# Patient Record
Sex: Male | Born: 1965 | Race: White | Hispanic: No | Marital: Married | Smoking: Former smoker
Health system: Southern US, Community
[De-identification: ages and names within clinical notes are randomized; demographics above are authoritative.]

## PROBLEM LIST (undated history)

## (undated) HISTORY — PX: KNEE SURGERY: SHX244

## (undated) HISTORY — PX: HAND SURGERY: SHX662

---

## 2016-07-30 ENCOUNTER — Other Ambulatory Visit: Payer: Self-pay

## 2016-07-30 ENCOUNTER — Emergency Department (HOSPITAL_COMMUNITY): Payer: Self-pay

## 2016-07-30 ENCOUNTER — Encounter (HOSPITAL_COMMUNITY): Payer: Self-pay | Admitting: *Deleted

## 2016-07-30 ENCOUNTER — Emergency Department (HOSPITAL_COMMUNITY)
Admission: EM | Admit: 2016-07-30 | Discharge: 2016-07-30 | Disposition: A | Discharge status: Home / Self Care | Payer: Self-pay | Attending: Emergency Medicine | Admitting: Emergency Medicine

## 2016-07-30 DIAGNOSIS — R072 Precordial pain: Secondary | ICD-10-CM | POA: Insufficient documentation

## 2016-07-30 DIAGNOSIS — R079 Chest pain, unspecified: Secondary | ICD-10-CM

## 2016-07-30 DIAGNOSIS — Z87891 Personal history of nicotine dependence: Secondary | ICD-10-CM | POA: Insufficient documentation

## 2016-07-30 LAB — BASIC METABOLIC PANEL
ANION GAP: 8 (ref 5–15)
BUN: 11 mg/dL (ref 6–20)
CALCIUM: 8.6 mg/dL — AB (ref 8.9–10.3)
CO2: 26 mmol/L (ref 22–32)
CREATININE: 0.89 mg/dL (ref 0.61–1.24)
Chloride: 104 mmol/L (ref 101–111)
GFR calc Af Amer: >60 mL/min (ref >60)
GLUCOSE: 90 mg/dL (ref 65–99)
Potassium: 3.7 mmol/L (ref 3.5–5.1)
Sodium: 138 mmol/L (ref 135–145)

## 2016-07-30 LAB — HEPATIC FUNCTION PANEL
ALT: 94 U/L — ABNORMAL HIGH (ref 17–63)
AST: 112 U/L — ABNORMAL HIGH (ref 15–41)
Albumin: 4.2 g/dL (ref 3.5–5.0)
Alkaline Phosphatase: 63 U/L (ref 38–126)
BILIRUBIN DIRECT: 0.2 mg/dL (ref 0.1–0.5)
BILIRUBIN INDIRECT: 0.3 mg/dL (ref 0.3–0.9)
TOTAL PROTEIN: 7.6 g/dL (ref 6.5–8.1)
Total Bilirubin: 0.5 mg/dL (ref 0.3–1.2)

## 2016-07-30 LAB — CBC
HCT: 41.8 % (ref 39.0–52.0)
Hemoglobin: 14.2 g/dL (ref 13.0–17.0)
MCH: 31.3 pg (ref 26.0–34.0)
MCHC: 34 g/dL (ref 30.0–36.0)
MCV: 92.3 fL (ref 78.0–100.0)
PLATELETS: 102 10*3/uL — AB (ref 150–400)
RBC: 4.53 MIL/uL (ref 4.22–5.81)
RDW: 12.6 % (ref 11.5–15.5)
WBC: 6.8 10*3/uL (ref 4.0–10.5)

## 2016-07-30 LAB — TROPONIN I: Troponin I: <0.03 ng/mL (ref <0.03)

## 2016-07-30 LAB — D-DIMER, QUANTITATIVE (NOT AT ARMC): D DIMER QUANT: 0.69 ug{FEU}/mL — AB (ref 0.00–0.50)

## 2016-07-30 LAB — LIPASE, BLOOD: LIPASE: 39 U/L (ref 11–51)

## 2016-07-30 MED ORDER — FENTANYL CITRATE (PF) 100 MCG/2ML IJ SOLN
50.0000 ug | INTRAMUSCULAR | Status: DC | PRN
  Administered 2016-07-30: 50 ug via INTRAVENOUS
  Filled 2016-07-30: qty 2

## 2016-07-30 MED ORDER — KETOROLAC TROMETHAMINE 30 MG/ML IJ SOLN
30.0000 mg | Freq: Once | INTRAMUSCULAR | Status: AC
  Administered 2016-07-30: 30 mg via INTRAVENOUS
  Filled 2016-07-30: qty 1

## 2016-07-30 MED ORDER — ASPIRIN EC 325 MG PO TBEC: 325.0000 mg | DELAYED_RELEASE_TABLET | Freq: Every day | ORAL | 0 refills | Status: AC

## 2016-07-30 MED ORDER — IOPAMIDOL (ISOVUE-370) INJECTION 76%
100.0000 mL | Freq: Once | INTRAVENOUS | Status: AC | PRN
  Administered 2016-07-30: 100 mL via INTRAVENOUS

## 2016-07-30 NOTE — ED Triage Notes (Signed)
Pt c/o chest pain x 3 days with the pain getting worse tonight while watching tv and lying down; pt states he is unable to take a deep breath; O2 sats maintaining in the upper 90's; pt is coughing up blood tinged clear sputum

## 2016-07-30 NOTE — ED Provider Notes (Signed)
[facility]-EM[facility]Y DEPT Provider Note   CSN: [id number] Arrival date & time: 07/30/16  [id number]  First Provider Contact:  First MD Initiated Contact with Patient 07/30/16 0201        History   Chief Complaint Chief Complaint  Patient presents with  . Chest Pain    HPI Bradly BienenstockRobert Siegenthaler is a 50 y.o. male.  The history is provided by the patient and a significant other.  Chest Pain   This is a new problem. The current episode started more than 2 days ago. The problem occurs daily. The problem has been gradually worsening. The pain is present in the substernal region. The pain is moderate. The pain does not radiate. The symptoms are aggravated by deep breathing. Associated symptoms include nausea and shortness of breath. Pertinent negatives include no cough, no diaphoresis and no fever. He has tried nothing for the symptoms. Risk factors include male gender.  Pertinent negatives for past medical history include no DVT, no MI and no PE.  Patient reports for past 3-4 days he has had left sided CP Seems worse with deep breathing but endorses pressure like sensation No fever He reports nausea and dry heaving He denies cough/hemoptysis (told nurse he had hemoptysis but tells me it is due to "dry heaving") No LE pain/edema No syncope He denies h/o CAD/PE/DVT [place], but reports CP started prior to his trip    PMH - GERD Soc hx -occasional ETOH use, smokes cigarettes Fam hx - negative for premature CAD  Past Surgical History:  Procedure Laterality Date  . HAND SURGERY Right   . KNEE SURGERY Left        Home Medications    Prior to Admission medications   Medication Sig Start Date End Date Taking? Authorizing Provider  esomeprazole (NEXIUM) 20 MG capsule Take 20 mg by mouth daily at 12 noon.   Yes Historical Provider, MD    Family History History reviewed. No pertinent family history.  Social History Social History  Substance Use Topics  .  Smoking status: Former Games developermoker  . Smokeless tobacco: Former NeurosurgeonUser  . Alcohol use Yes     Comment: occasionally     Allergies   Penicillins   Review of Systems Review of Systems  Constitutional: Negative for diaphoresis and fever.  Respiratory: Positive for shortness of breath. Negative for cough.   Cardiovascular: Positive for chest pain.  Gastrointestinal: Positive for nausea.  Neurological: Negative for syncope.  All other systems reviewed and are negative.    Physical Exam Updated Vital Signs BP (!) 147/104   Pulse 90   Temp 98.7 F (37.1 C) (Oral)   SpO2 97%   Physical Exam CONSTITUTIONAL: Well developed/well nourished, anxious HEAD: Normocephalic/atraumatic EYES: EOMI/PERRL ENMT: Mucous membranes moist NECK: supple no meningeal signs SPINE/BACK:entire spine nontender CV: S1/S2 noted, no murmurs/rubs/gallops noted LUNGS: Lungs are clear to auscultation bilaterally, no apparent distress Chest - no tenderness or bruising noted ABDOMEN: soft, nontender, no rebound or guarding, bowel sounds noted throughout abdomen GU:no cva tenderness NEURO: Pt is awake/alert/appropriate, moves all extremitiesx4.  No facial droop.   EXTREMITIES: pulses normal/equal, full ROM, no LE edema/tenderness SKIN: warm, color normal PSYCH: no abnormalities of mood noted, alert and oriented to situation   ED Treatments / Results  Labs (all labs ordered are listed, but only abnormal results are displayed) Labs Reviewed  BASIC METABOLIC PANEL - Abnormal; Notable for the following:       Result Value  Calcium 8.6 (*)    All other components within normal limits  CBC - Abnormal; Notable for the following:    Platelets 102 (*)    All other components within normal limits  HEPATIC FUNCTION PANEL - Abnormal; Notable for the following:    AST 112 (*)    ALT 94 (*)    All other components within normal limits  D-DIMER, QUANTITATIVE (NOT AT [facility]) - Abnormal; Notable for the following:     D-Dimer, Quant 0.69 (*)    All other components within normal limits  TROPONIN I  LIPASE, BLOOD  TROPONIN I    EKG ED ECG REPORT   Date: 07/30/2016 01:08am  Rate: 83  Rhythm: normal sinus rhythm  QRS Axis: normal  Intervals: normal  ST/T Wave abnormalities: nonspecific ST changes  Conduction Disutrbances:none  Narrative Interpretation: PVCs noted  Old EKG Reviewed: none available  I have personally reviewed the EKG tracing and agree with the computerized printout as noted.  Radiology Dg Chest 2 View  Result Date: 07/30/2016 CLINICAL DATA:  Left-sided chest pain for 3 days, worse tonight. EXAM: CHEST  2 VIEW COMPARISON:  None. FINDINGS: The cardiomediastinal contours are normal. The lungs are clear. Pulmonary vasculature is normal. No consolidation, pleural effusion, or pneumothorax. No acute osseous abnormalities are seen. Degenerative change in the lower thoracic spine IMPRESSION: No acute pulmonary process. Electronically Signed   By: Rubye Oaks M.D.   On: 07/30/2016 03:33   Ct Angio Chest Pe W And/or Wo Contrast  Result Date: 07/30/2016 CLINICAL DATA:  Chest pain for 3 days and productive cough. EXAM: CT ANGIOGRAPHY CHEST WITH CONTRAST TECHNIQUE: Multidetector CT imaging of the chest was performed using the standard protocol during bolus administration of intravenous contrast. Multiplanar CT image reconstructions and MIPs were obtained to evaluate the vascular anatomy. CONTRAST:  100 mL Isovue 370 IV COMPARISON:  Chest x-ray earlier today FINDINGS: Cardiovascular: The pulmonary arteries are well opacified. There is no evidence of pulmonary embolism. The thoracic aorta is normal in caliber. Coronary atherosclerosis identified with visible calcified plaque in the distribution of the LAD and left circumflex coronary arteries. The heart size is normal. No pericardial fluid identified. Mediastinum/Nodes: No evidence of mediastinal masses or lymphadenopathy. No hilar lymphadenopathy  identified. Lungs/Pleura: Mild scarring present at the left lung base. There is no evidence of pulmonary edema, consolidation, pneumothorax, nodule or pleural fluid. Upper Abdomen: Visualized upper abdominal structures are unremarkable. Musculoskeletal: Normal visualized bony structures. Review of the MIP images confirms the above findings. IMPRESSION: No evidence of pulmonary embolism or other acute findings. Evidence of coronary atherosclerosis with calcified plaque in the distribution of the LAD and left circumflex coronary arteries. Electronically Signed   By: Irish Lack M.D.   On: 07/30/2016 07:15    Procedures Procedures (including critical care time)  Medications Ordered in ED Medications  fentaNYL (SUBLIMAZE) injection 50 mcg (50 mcg Intravenous Given 07/30/16 0202)  ketorolac (TORADOL) 30 MG/ML injection 30 mg (30 mg Intravenous Given 07/30/16 0459)  iopamidol (ISOVUE-370) 76 % injection 100 mL (100 mLs Intravenous Contrast Given 07/30/16 0702)     Initial Impression / Assessment and Plan / ED Course  I have reviewed the triage vital signs and the nursing notes.  Pertinent labs & imaging results that were available during my care of the patient were reviewed by me and considered in my medical decision making (see chart for details).  Clinical Course    Pt appears anxious Current HEART score = 3 Workup pending  at this time 5:01 AM Pt with continued pain, though improved It remains in one location, worse with inspiration Pulse ox dropped to 94% Will add on d-dimer Repeat ekg performed  EKG Interpretation  Date/Time:  Wednesday July 30 2016 04:29:48 EDT Ventricular Rate:  75 PR Interval:    QRS Duration: 97 QT Interval:  399 QTC Calculation: 446 R Axis:   23 Text Interpretation:  Sinus rhythm RSR' in V1 or V2, probably normal variant no PVC noted on this EKG Confirmed by Bebe Shaggy  MD, Vaibhav Fogleman ([id number]) on 07/30/2016 5:01:34 AM      7:57 AM Pt improved He reports most  of his CP is worse with breathing.  He also reports some of his CP is worse with lying on his left side History very atypical for ACS.  He does report some pain with walking but also positional/pleuritic PE ruled out by CT chest He did have some coronary calcification on CT This was d/w patient He will be started on ASA. He has been referred for close f/u with cardiology as he has no PCP and just moved here Discussed strict ER return precautions, particularly any severe CP/pressure with sob/diaphoresis/vomiting Advised to quit smoking   Final Clinical Impressions(s) / ED Diagnoses   Final diagnoses:  Chest pain, unspecified chest pain type    New Prescriptions New Prescriptions   ASPIRIN EC 325 MG TABLET    Take 1 tablet (325 mg total) by mouth daily.     Zadie Rhine, MD 07/30/16 0800

## 2016-07-30 NOTE — Discharge Instructions (Signed)
YOU NEED TO HAVE CLOSE FOLLOWUP WITH HEART SPECIALIST AS WE DISCUSSED YOU MAY HAVE HEART DISEASE AND YOU NEED CLOSE FOLLOWUP AND TESTING PLEASE CALL THE NUMBER PROVIDED FOR FOLLOWUP SOON  Your caregiver has diagnosed you as having chest pain that is not specific for one problem, but does not require admission.  Chest pain comes from many different causes.  SEEK IMMEDIATE MEDICAL ATTENTION IF: You have severe chest pain, especially if the pain is crushing or pressure-like and spreads to the arms, back, neck, or jaw, or if you have sweating, nausea (feeling sick to your stomach), or shortness of breath. THIS IS AN EMERGENCY. Don't wait to see if the pain will go away. Get medical help at once. Call 911 or [phone] (operator). DO NOT drive yourself to the hospital.  Your chest pain gets worse and does not go away with rest.  You have an attack of chest pain lasting longer than usual, despite rest and treatment with the medications your caregiver has prescribed.  You wake from sleep with chest pain or shortness of breath.  You feel dizzy or faint.  You have chest pain not typical of your usual pain for which you originally saw your caregiver.

## 2016-07-30 NOTE — ED Notes (Signed)
Pt

## 2016-07-30 NOTE — ED Notes (Addendum)
Iv insertion not charted. Iv 20 g to right lateral fa taken out with cath intact. Skin integrity intact. EDP aware of dc bp. Pt educated on importance of fu with cardiologist

## 2016-08-14 ENCOUNTER — Encounter: Payer: Self-pay | Admitting: Cardiology

## 2018-02-07 IMAGING — DX DG CHEST 2V
2 series · 2 of 2 positions shown · non-contrast
Comparison: None.

CLINICAL DATA: Left-sided chest pain for 3 days, worse tonight.

EXAM:
CHEST  2 VIEW

[chest pa]
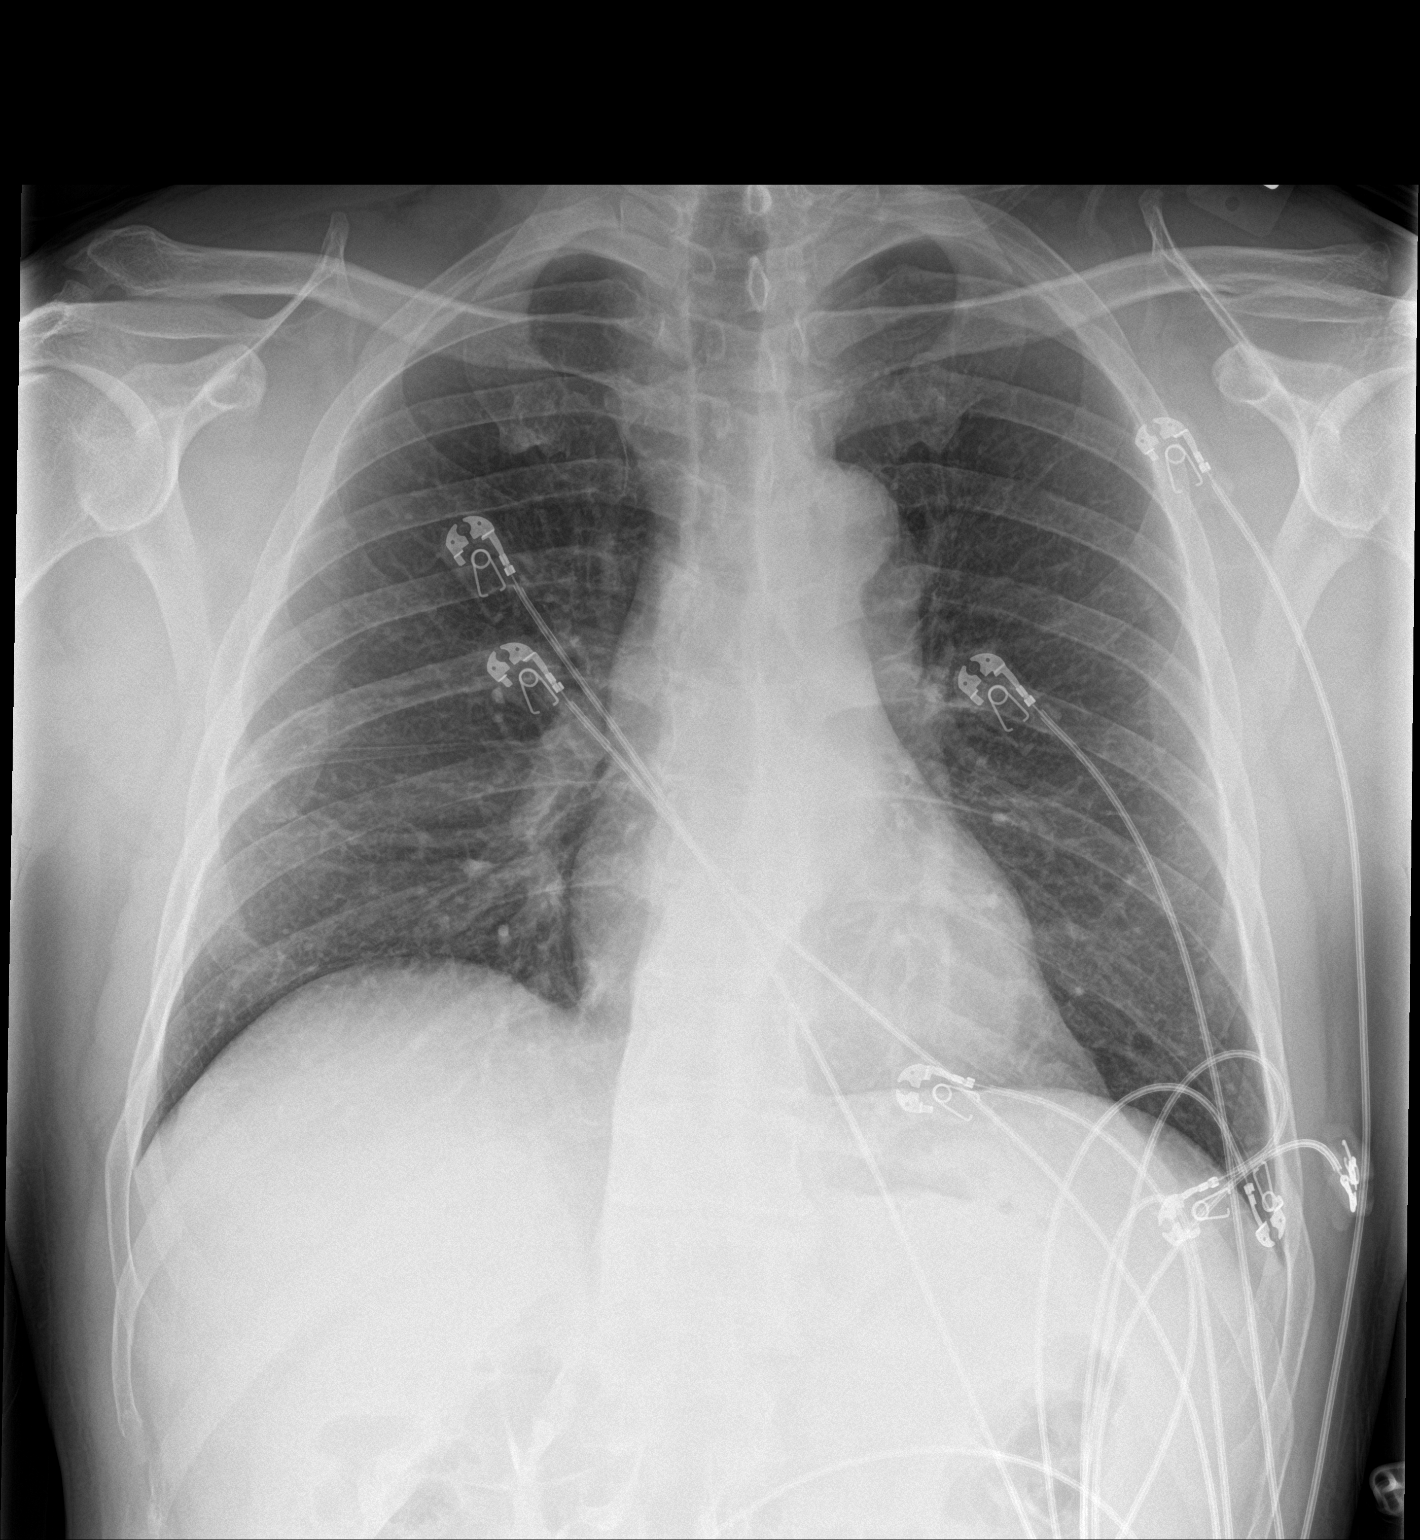

[chest lat]
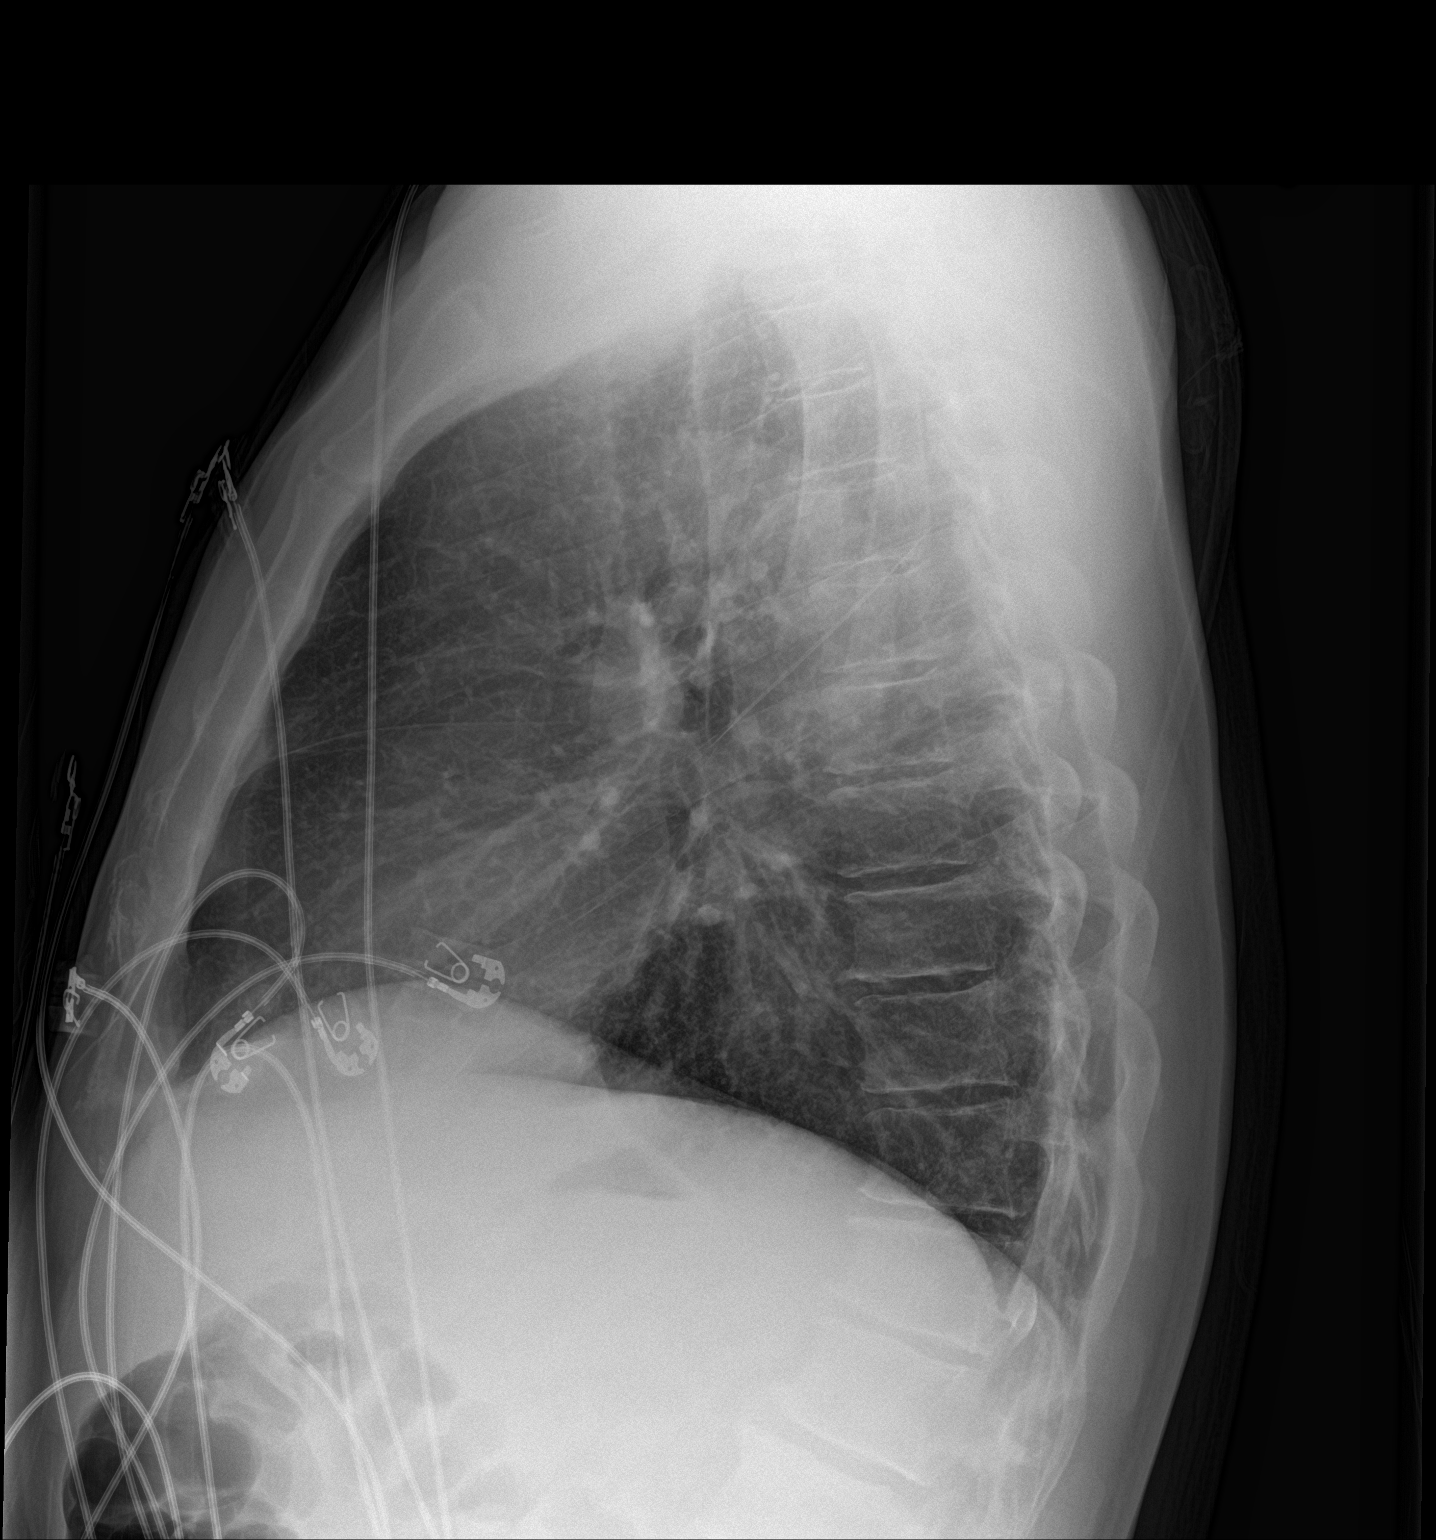

[2 of 2 positions shown; findings below may reference images not displayed]

FINDINGS: The cardiomediastinal contours are normal. The lungs are clear.
Pulmonary vasculature is normal. No consolidation, pleural effusion,
or pneumothorax. No acute osseous abnormalities are seen.
Degenerative change in the lower thoracic spine
IMPRESSION: No acute pulmonary process.

## 2018-02-07 IMAGING — CT CT ANGIO CHEST
2 of 6 series · 18 of 46 positions shown · IV contrast (ISOVUE)
Comparison: Chest x-ray earlier today

CLINICAL DATA: Chest pain for 3 days and productive cough.

EXAM:
CT ANGIOGRAPHY CHEST WITH CONTRAST
TECHNIQUE: Multidetector CT imaging of the chest was performed using the
standard protocol during bolus administration of intravenous
contrast. Multiplanar CT image reconstructions and MIPs were
obtained to evaluate the vascular anatomy.
CONTRAST:  100 mL Isovue 370 IV

[Series 5: pe thins 1.0 · axial · 0.71mm/px · z∈[-224,-6]mm · 15 of 244 slices shown]
[im 13/244  lung]
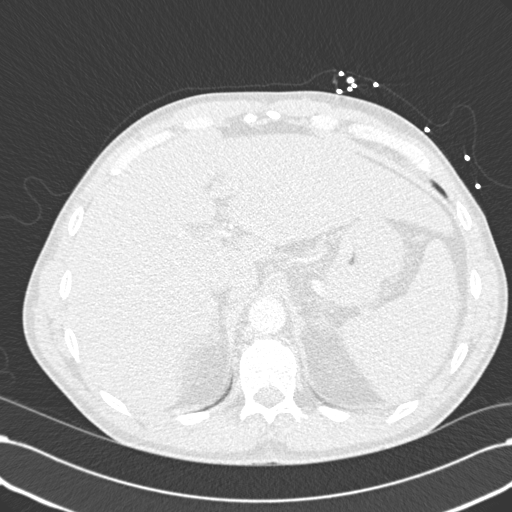
[im 25/244  soft-tissue]
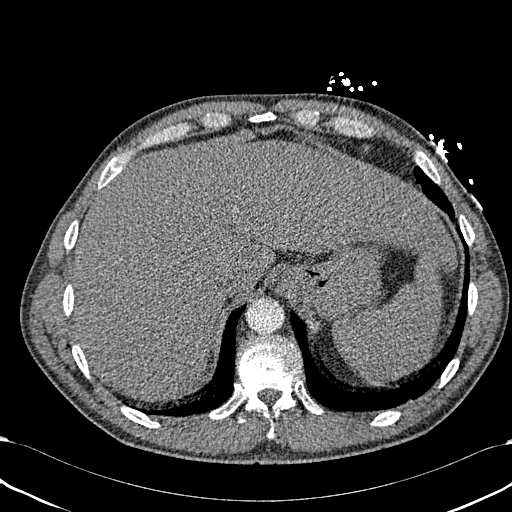
[im 49/244  lung]
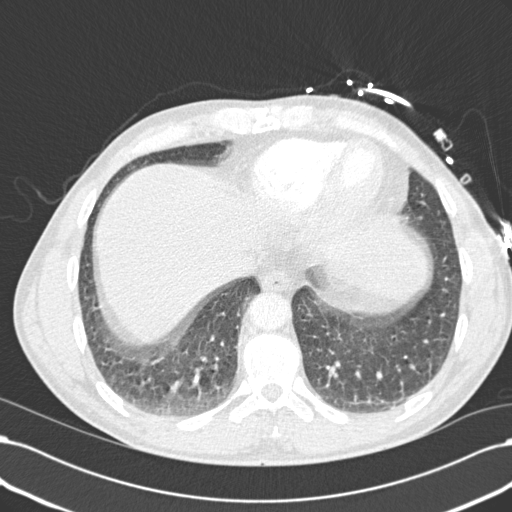
[im 61/244  soft-tissue]
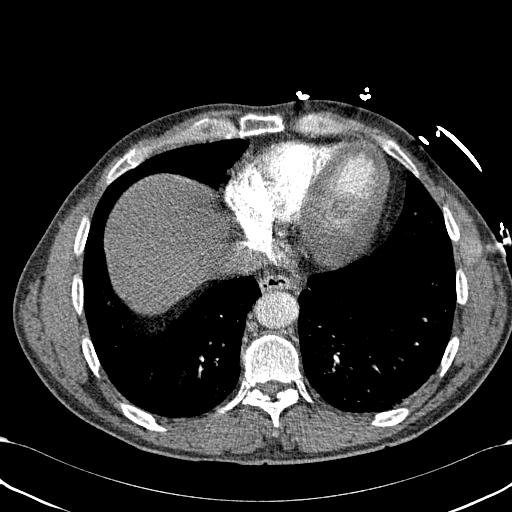
[im 73/244  lung]
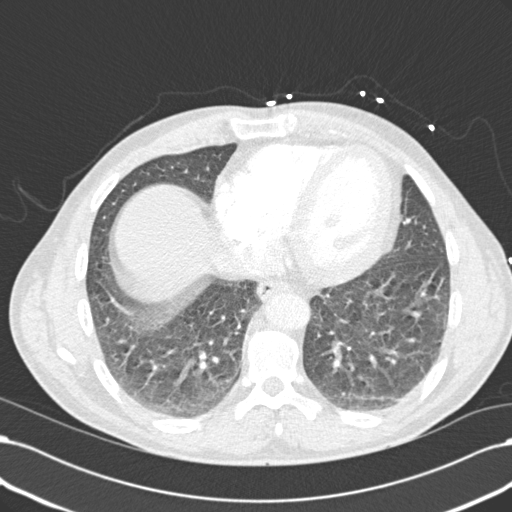
[im 86/244  soft-tissue]
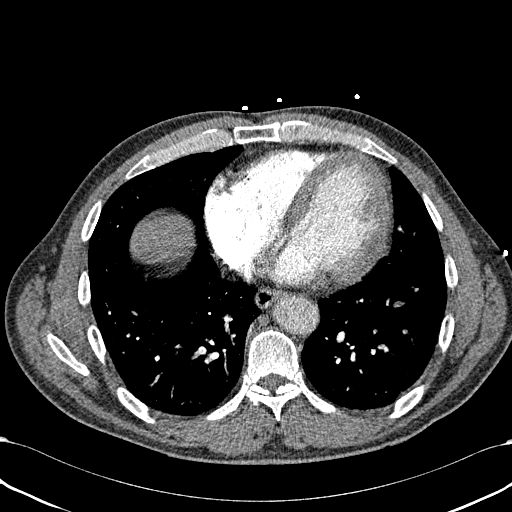
[im 110/244  lung]
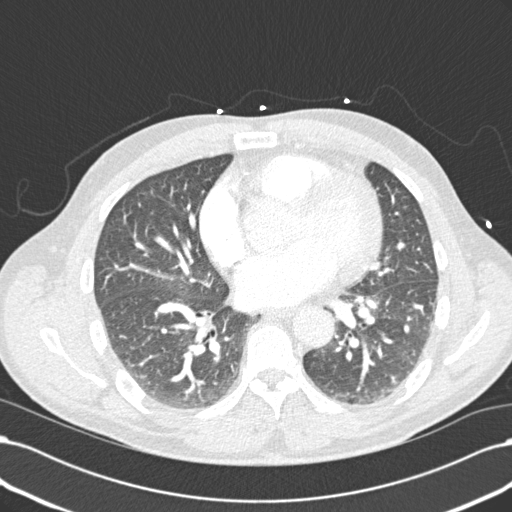
[im 122/244  soft-tissue]
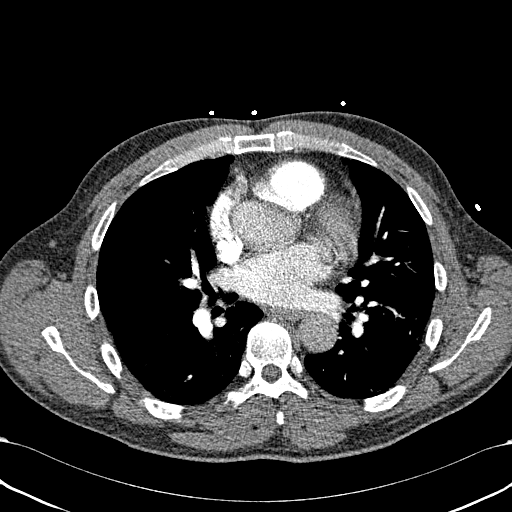
[im 134/244  lung]
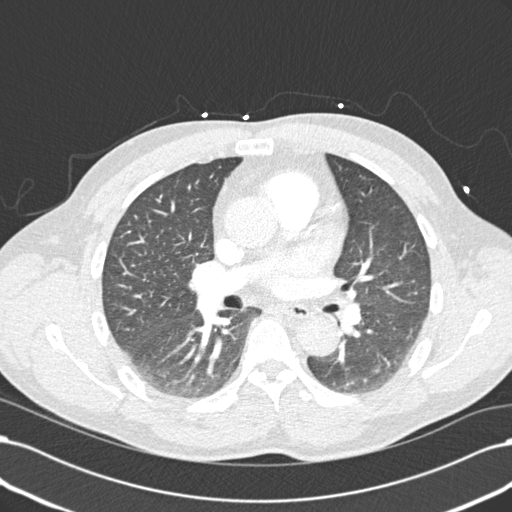
[im 158/244  soft-tissue]
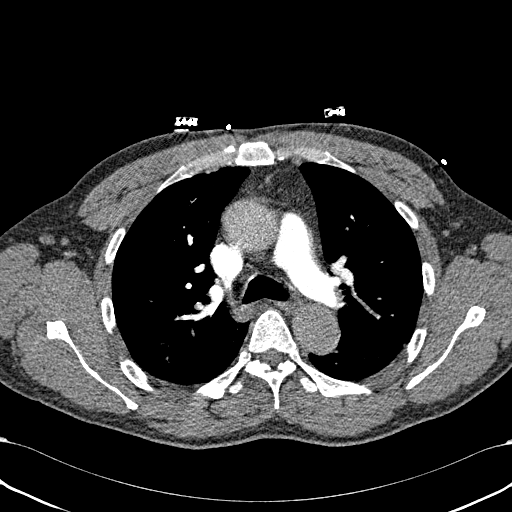
[im 171/244  lung]
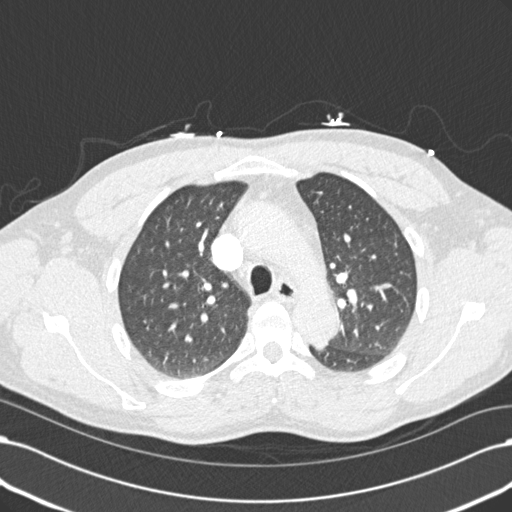
[im 183/244  soft-tissue]
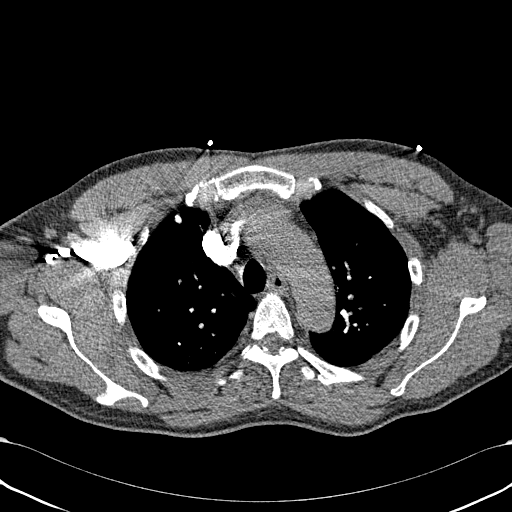
[im 195/244  lung]
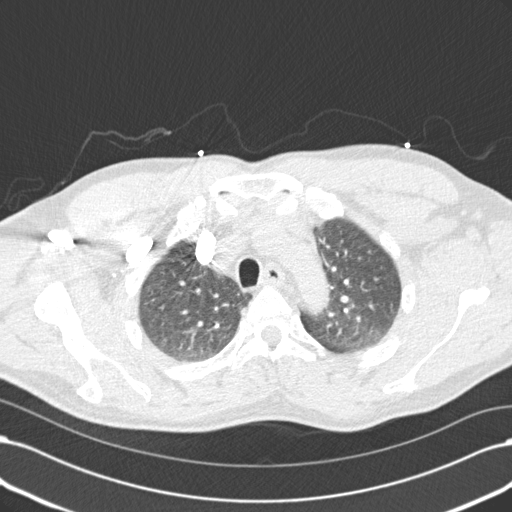
[im 219/244  soft-tissue]
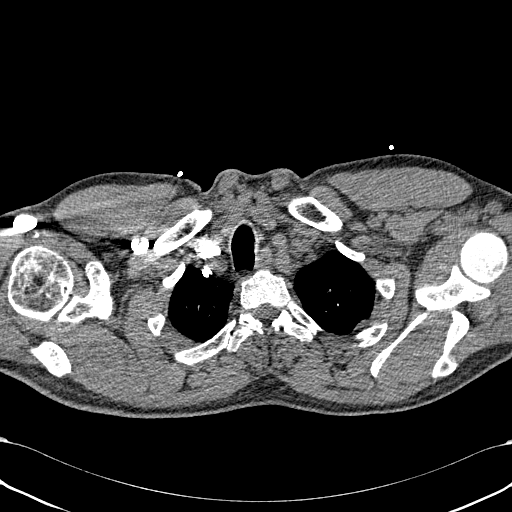
[im 231/244  lung]
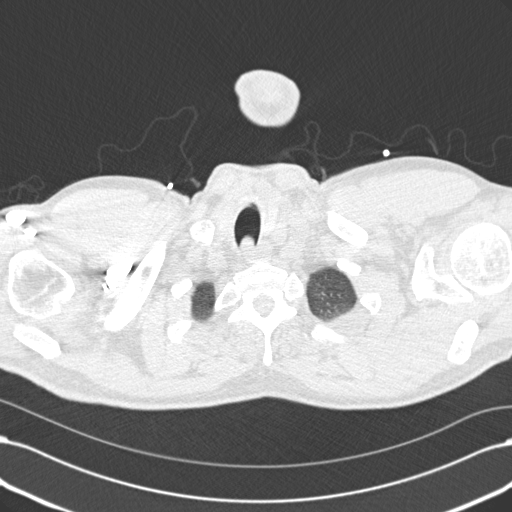

[Series 7: cor mpr 2.0 · coronal · 0.50mm/px · 3 of 118 slices shown]
[im 30/118  soft-tissue]
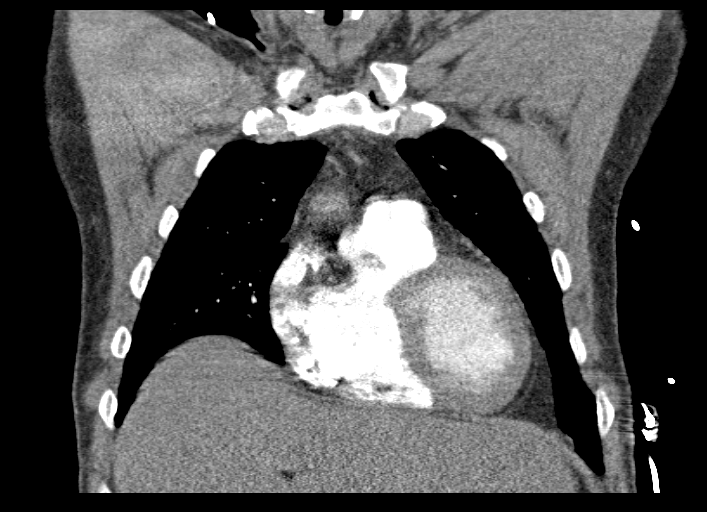
[im 59/118  soft-tissue]
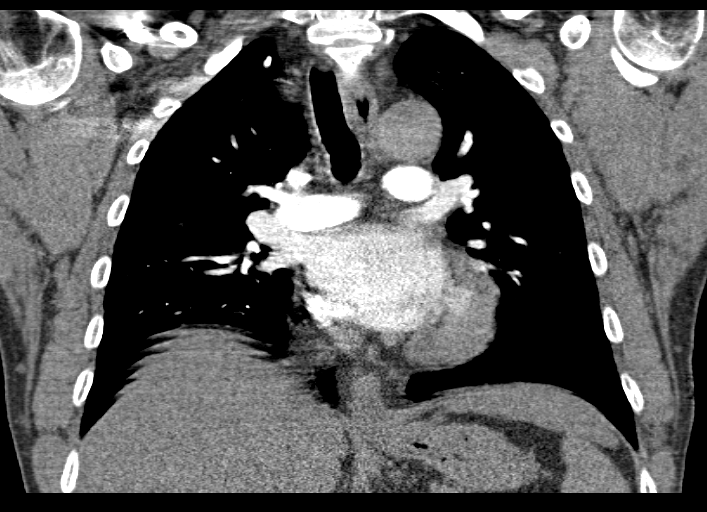
[im 88/118  soft-tissue]
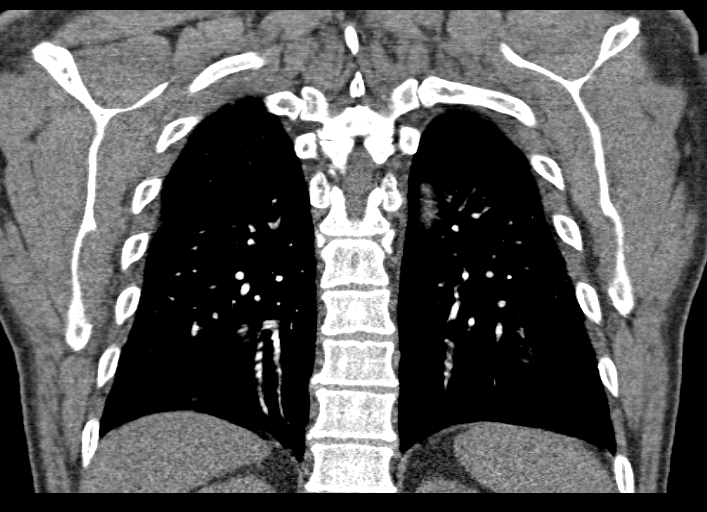

[18 of 46 positions shown; findings below may reference images not displayed]

FINDINGS: Cardiovascular: The pulmonary arteries are well opacified. There is
no evidence of pulmonary embolism. The thoracic aorta is normal in
caliber. Coronary atherosclerosis identified with visible calcified
plaque in the distribution of the LAD and left circumflex coronary
arteries. The heart size is normal. No pericardial fluid identified.

Mediastinum/Nodes: No evidence of mediastinal masses or
lymphadenopathy. No hilar lymphadenopathy identified.

Lungs/Pleura: Mild scarring present at the left lung base. There is
no evidence of pulmonary edema, consolidation, pneumothorax, nodule
or pleural fluid.

Upper Abdomen: Visualized upper abdominal structures are
unremarkable.

Musculoskeletal: Normal visualized bony structures.

Review of the MIP images confirms the above findings.
IMPRESSION: No evidence of pulmonary embolism or other acute findings. Evidence
of coronary atherosclerosis with calcified plaque in the
distribution of the LAD and left circumflex coronary arteries.
